# Patient Record
Sex: Male | Born: 1995 | Race: White | Hispanic: No | Marital: Single | Smoking: Never smoker
Health system: Southern US, Community
[De-identification: ages and names within clinical notes are randomized; demographics above are authoritative.]

## PROBLEM LIST (undated history)

## (undated) DIAGNOSIS — M1990 Unspecified osteoarthritis, unspecified site: Secondary | ICD-10-CM

---
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
## 2017-09-17 DIAGNOSIS — L74513 Primary focal hyperhidrosis, soles: Secondary | ICD-10-CM | POA: Diagnosis not present

## 2017-09-17 DIAGNOSIS — L400 Psoriasis vulgaris: Secondary | ICD-10-CM | POA: Diagnosis not present

## 2017-10-22 DIAGNOSIS — L255 Unspecified contact dermatitis due to plants, except food: Secondary | ICD-10-CM | POA: Diagnosis not present

## 2018-01-21 DIAGNOSIS — L74512 Primary focal hyperhidrosis, palms: Secondary | ICD-10-CM | POA: Diagnosis not present

## 2018-01-21 DIAGNOSIS — L400 Psoriasis vulgaris: Secondary | ICD-10-CM | POA: Diagnosis not present

## 2018-01-21 DIAGNOSIS — L738 Other specified follicular disorders: Secondary | ICD-10-CM | POA: Diagnosis not present

## 2018-02-16 DIAGNOSIS — L409 Psoriasis, unspecified: Secondary | ICD-10-CM | POA: Diagnosis not present

## 2018-02-16 DIAGNOSIS — M2550 Pain in unspecified joint: Secondary | ICD-10-CM | POA: Diagnosis not present

## 2018-02-16 DIAGNOSIS — M545 Low back pain: Secondary | ICD-10-CM | POA: Diagnosis not present

## 2018-03-23 DIAGNOSIS — L4050 Arthropathic psoriasis, unspecified: Secondary | ICD-10-CM | POA: Diagnosis not present

## 2018-03-23 DIAGNOSIS — R768 Other specified abnormal immunological findings in serum: Secondary | ICD-10-CM | POA: Diagnosis not present

## 2018-03-23 DIAGNOSIS — L409 Psoriasis, unspecified: Secondary | ICD-10-CM | POA: Diagnosis not present

## 2018-06-23 DIAGNOSIS — R768 Other specified abnormal immunological findings in serum: Secondary | ICD-10-CM | POA: Diagnosis not present

## 2018-06-23 DIAGNOSIS — L409 Psoriasis, unspecified: Secondary | ICD-10-CM | POA: Diagnosis not present

## 2018-06-23 DIAGNOSIS — L4050 Arthropathic psoriasis, unspecified: Secondary | ICD-10-CM | POA: Diagnosis not present

## 2019-08-22 ENCOUNTER — Other Ambulatory Visit: Payer: Self-pay

## 2019-08-22 ENCOUNTER — Emergency Department (HOSPITAL_COMMUNITY)
Admission: EM | Admit: 2019-08-22 | Discharge: 2019-08-22 | Disposition: A | Discharge status: Home / Self Care | Payer: BLUE CROSS/BLUE SHIELD | Attending: Emergency Medicine | Admitting: Emergency Medicine

## 2019-08-22 ENCOUNTER — Encounter (HOSPITAL_COMMUNITY): Payer: Self-pay

## 2019-08-22 ENCOUNTER — Emergency Department (HOSPITAL_COMMUNITY): Payer: BLUE CROSS/BLUE SHIELD

## 2019-08-22 DIAGNOSIS — S51011A Laceration without foreign body of right elbow, initial encounter: Secondary | ICD-10-CM | POA: Insufficient documentation

## 2019-08-22 DIAGNOSIS — Y92099 Unspecified place in other non-institutional residence as the place of occurrence of the external cause: Secondary | ICD-10-CM | POA: Insufficient documentation

## 2019-08-22 DIAGNOSIS — Y9383 Activity, rough housing and horseplay: Secondary | ICD-10-CM | POA: Insufficient documentation

## 2019-08-22 DIAGNOSIS — W010XXA Fall on same level from slipping, tripping and stumbling without subsequent striking against object, initial encounter: Secondary | ICD-10-CM | POA: Insufficient documentation

## 2019-08-22 DIAGNOSIS — L03113 Cellulitis of right upper limb: Secondary | ICD-10-CM | POA: Insufficient documentation

## 2019-08-22 DIAGNOSIS — Y999 Unspecified external cause status: Secondary | ICD-10-CM | POA: Insufficient documentation

## 2019-08-22 DIAGNOSIS — Z23 Encounter for immunization: Secondary | ICD-10-CM | POA: Insufficient documentation

## 2019-08-22 HISTORY — DX: Unspecified osteoarthritis, unspecified site: M19.90

## 2019-08-22 MED ORDER — DOXYCYCLINE HYCLATE 100 MG PO CAPS: 100.0000 mg | ORAL_CAPSULE | Freq: Two times a day (BID) | ORAL | 0 refills | Status: AC

## 2019-08-22 MED ORDER — IBUPROFEN 800 MG PO TABS
800.0000 mg | ORAL_TABLET | Freq: Once | ORAL | Status: AC
  Administered 2019-08-22: 800 mg via ORAL
  Filled 2019-08-22: qty 1

## 2019-08-22 MED ORDER — TETANUS-DIPHTH-ACELL PERTUSSIS 5-2.5-18.5 LF-MCG/0.5 IM SUSP
0.5000 mL | Freq: Once | INTRAMUSCULAR | Status: AC
  Administered 2019-08-22: 0.5 mL via INTRAMUSCULAR
  Filled 2019-08-22: qty 0.5

## 2019-08-22 MED ORDER — DOXYCYCLINE HYCLATE 100 MG PO TABS
100.0000 mg | ORAL_TABLET | Freq: Once | ORAL | Status: AC
  Administered 2019-08-22: 100 mg via ORAL
  Filled 2019-08-22: qty 1

## 2019-08-22 NOTE — ED Notes (Signed)
Pt given water for Fluid challenge
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
## 2019-08-22 NOTE — ED Provider Notes (Signed)
[facility] EMERGENCY DEPARTMENT Provider Note   CSN: [id number] Arrival date & time: 08/22/19  1443     History Chief Complaint  Patient presents with  . elbow redness    Juan Mclean is a 24 y.o. male.  HPI  Patient is a 24 year old male with no significant past medical history presented today for right elbow pain.  Patient states that he fell 1 month ago and cut his right elbow while wrestling with a friend of his.  He states that the area was slowly healing although it would open up occasionally due to the pain on his elbow.  He states that Friday he was roughhousing his friends again and fell and hit his right elbow on the ground again.  He states that this time there is no bleeding however since that time it is swollen and appears more red and irritated.  He states that it is painful to completely extend but no pain with flexion.  Denies any fevers, chills, headache, dizziness.     Past Medical History:  Diagnosis Date  . Arthritis     There are no problems to display for this patient.   History reviewed. No pertinent surgical history.     No family history on file.  Social History   Tobacco Use  . Smoking status: Never Smoker  Substance Use Topics  . Alcohol use: No  . Drug use: Not on file    Home Medications Prior to Admission medications   Medication Sig Start Date End Date Taking? Authorizing Provider  doxycycline (VIBRAMYCIN) 100 MG capsule Take 1 capsule (100 mg total) by mouth 2 (two) times daily. 08/22/19   Gailen Shelter, PA  ibuprofen (ADVIL,MOTRIN) 600 MG tablet Take 1 tablet (600 mg total) by mouth every 6 (six) hours as needed for pain. 12/27/12   Marcellina Millin, MD    Allergies    Erythromycin and Peanuts [peanut oil]  Review of Systems   Review of Systems  Constitutional: Negative for chills and fever.  HENT: Negative for congestion.   Eyes: Negative for pain.  Respiratory: Negative for cough and shortness of breath.    Cardiovascular: Negative for chest pain and leg swelling.  Gastrointestinal: Negative for abdominal pain and vomiting.  Genitourinary: Negative for dysuria.  Musculoskeletal: Negative for myalgias.       Left elbow pain, redness and swelling  Skin: Negative for rash.  Neurological: Negative for dizziness and headaches.    Physical Exam Updated Vital Signs BP 134/74 (BP Location: Right Arm)   Pulse (!) 110   Temp 98.6 F (37 C) (Oral)   Resp 18   SpO2 100%   Physical Exam Vitals and nursing note reviewed.  Constitutional:      General: He is not in acute distress.    Appearance: Normal appearance. He is not ill-appearing.  HENT:     Head: Normocephalic and atraumatic.     Mouth/Throat:     Mouth: Mucous membranes are moist.  Eyes:     General: No scleral icterus.       Right eye: No discharge.        Left eye: No discharge.     Conjunctiva/sclera: Conjunctivae normal.  Cardiovascular:     Pulses: Normal pulses.     Comments: Heart rate 102 on my exam  Bilateral radial pulses 3+ and symmetric. Pulmonary:     Effort: Pulmonary effort is normal.     Breath sounds: No stridor.  Musculoskeletal:  Comments: Full range of motion of elbow although there is some pain with completely locking elbow in extension.  Skin:    Comments: Left elbow with small 1 cm laceration over the olecranon.  It is in the process of healing but does appear open enough to drain if there is any purulence inside.  Was able to express any purulence, no fluctuance.  It is tender and red surrounding tissue extending out from the laceration approximately 4 inches in all directions.  There is no lymphangitis streaking.  Axillary lymph nodes are not palpable and are without any swelling or tenderness  Neurological:     Mental Status: He is alert and oriented to person, place, and time. Mental status is at baseline.            ED Results / Procedures / Treatments   Labs (all labs ordered are  listed, but only abnormal results are displayed) Labs Reviewed - No data to display  EKG None  Radiology DG Elbow Complete Right  Result Date: 08/22/2019 CLINICAL DATA:  Pain and swelling following fall 2 weeks ago, initial encounter EXAM: RIGHT ELBOW - COMPLETE 2 VIEW COMPARISON:  None. FINDINGS: No acute fracture or dislocation is noted. Mild soft tissue swelling is noted. IMPRESSION: Soft tissue swelling without acute bony abnormality. Electronically Signed   By: Inez Catalina M.D.   On: 08/22/2019 16:40    Procedures Ultrasound ED Soft Tissue  Date/Time: 08/22/2019 4:39 PM Performed by: Tedd Sias, PA Authorized by: Tedd Sias, PA   Procedure details:    Indications: localization of abscess     Transverse view:  Visualized   Longitudinal view:  Visualized   Images: not archived   Location:    Location: upper extremity     Side:  Left Findings:     no abscess present    cellulitis present Comments:     Cellulitis identified with ultrasound of the left elbow.  No significant fluid collection.  There is some cobblestoning present on ultrasound that does look suspicious for early abscess however laceration is somewhat open and would allow for any drainage.   (including critical care time)  Medications Ordered in ED Medications  ibuprofen (ADVIL) tablet 800 mg (800 mg Oral Given 08/22/19 1641)  doxycycline (VIBRA-TABS) tablet 100 mg (100 mg Oral Given 08/22/19 1641)    ED Course  I have reviewed the triage vital signs and the nursing notes.  Pertinent labs & imaging results that were available during my care of the patient were reviewed by me and considered in my medical decision making (see chart for details).  Clinical Course as of Aug 21 1649  Sun Aug 22, 2019  1644 X-ray of the elbow is without fracture or bony abnormality.  No evidence of osteomyelitis.  There is soft tissue swelling consistent with physical exam.   [WF]    Clinical Course User  Index [WF] Tedd Sias, [place]   MDM Rules/Calculators/A&P                      Patient with traumatic left elbow pain is had 2 injuries one 1 month ago and one Friday.  He went to urgent care this morning had an x-ray done and was told that it was unremarkable however he was instructed to follow-up with the ED with concern for cellulitis.  Cellulitis identified with ultrasound of the left elbow.  No significant fluid collection.   There is some cobblestoning present on  ultrasound that does look suspicious for early abscess however laceration is somewhat open and would allow for any drainage.  X-ray is unremarkable.  No evidence of osteomyelitis.  Will place patient on doxycycline for treatment of cellulitis with MRSA coverage in case of purulent material inside.  Patient given strict return precautions and recommended follow-up with primary care doctor.   Doubt DVT.  His symptoms came on relatively quickly and are associated with tenderness and redness rather than full arm swelling.  Doubt septic arthritis as patient has nearly full range of motion of his left elbow.  Doubt Abscess As Ultrasound Shows No Significant Fluid Collection.  Doubt disseminated gonorrhea as patient has no history of diarrhea and is able to get well after trauma rather than any sexual contact.  Doubt arterial injury as he has intact pulses and significant swelling.  Most likely this is superficial and contain cellulitis.  Nursing staff drew around cellulitis with skin pen to indicate current extent of cellulitis.  He knows to return to ED for follow-up if he has any new or concerning symptoms.  On reevaluation he states his pain is much improved after ibuprofen.  His heart rate is now 94.  Vital signs are other completely within normal limits.  Final Clinical Impression(s) / ED Diagnoses Final diagnoses:  Cellulitis of right upper extremity    Rx / DC Orders ED Discharge Orders         Ordered    doxycycline  (VIBRAMYCIN) 100 MG capsule  2 times daily     08/22/19 1651           Solon [place], [place] 08/22/19 1651    Arby Barrette, MD 08/23/19 [phone]

## 2019-08-22 NOTE — Discharge Instructions (Addendum)
Please take doxycycline as prescribed.  Please use Tylenol and ibuprofen for pain.  Please follow-up with your primary care doctor within 1 week for reassessment if symptoms persist and return immediately to ED if you have any new or concerning symptoms.  Especially if you spike a fever or unable to move your arm.  Please use Tylenol or ibuprofen for pain.  You may use 600 mg ibuprofen every 6 hours or 1000 mg of Tylenol every 6 hours.  You may choose to alternate between the 2.  This would be most effective.  Not to exceed 4 g of Tylenol within 24 hours.  Not to exceed 3200 mg ibuprofen 24 hours.

## 2020-12-30 ENCOUNTER — Emergency Department (HOSPITAL_BASED_OUTPATIENT_CLINIC_OR_DEPARTMENT_OTHER): Payer: 59

## 2020-12-30 ENCOUNTER — Emergency Department (HOSPITAL_BASED_OUTPATIENT_CLINIC_OR_DEPARTMENT_OTHER)
Admission: EM | Admit: 2020-12-30 | Discharge: 2020-12-30 | Disposition: A | Discharge status: Home / Self Care | Payer: 59 | Attending: Emergency Medicine | Admitting: Emergency Medicine

## 2020-12-30 ENCOUNTER — Other Ambulatory Visit: Payer: Self-pay

## 2020-12-30 ENCOUNTER — Encounter (HOSPITAL_BASED_OUTPATIENT_CLINIC_OR_DEPARTMENT_OTHER): Payer: Self-pay | Admitting: *Deleted

## 2020-12-30 DIAGNOSIS — W1789XA Other fall from one level to another, initial encounter: Secondary | ICD-10-CM | POA: Diagnosis not present

## 2020-12-30 DIAGNOSIS — Y990 Civilian activity done for income or pay: Secondary | ICD-10-CM | POA: Diagnosis not present

## 2020-12-30 DIAGNOSIS — Z91010 Allergy to peanuts: Secondary | ICD-10-CM | POA: Diagnosis not present

## 2020-12-30 DIAGNOSIS — S52571A Other intraarticular fracture of lower end of right radius, initial encounter for closed fracture: Secondary | ICD-10-CM | POA: Insufficient documentation

## 2020-12-30 DIAGNOSIS — S52501A Unspecified fracture of the lower end of right radius, initial encounter for closed fracture: Secondary | ICD-10-CM

## 2020-12-30 DIAGNOSIS — S6991XA Unspecified injury of right wrist, hand and finger(s), initial encounter: Secondary | ICD-10-CM | POA: Diagnosis present

## 2020-12-30 MED ORDER — OXYCODONE-ACETAMINOPHEN 5-325 MG PO TABS: 1.0000 | ORAL_TABLET | Freq: Three times a day (TID) | ORAL | 0 refills | Status: AC | PRN

## 2020-12-30 NOTE — ED Provider Notes (Signed)
[facility] EMERGENCY DEPT Provider Note   CSN: [id number] Arrival date & time: 12/30/20  1616     History Chief Complaint  Patient presents with   Rt arm injury    Juan Mclean is a 25 y.o. male.  HPI Patient fell off a side-by-side.  Landing on his right wrist.  Pain and swelling in the right wrist.  States he is worried he dislocated it.  Had previous fracture of the left wrist was seen by [facility].  Did not remember who he saw there however.  No elbow pain.  Denies head.  Not on anticoagulation.    Past Medical History:  Diagnosis Date   Arthritis     There are no problems to display for this patient.   History reviewed. No pertinent surgical history.     No family history on file.  Social History   Tobacco Use   Smoking status: Never   Smokeless tobacco: Never  Vaping Use   Vaping Use: Never used  Substance Use Topics   Alcohol use: No   Drug use: Never    Home Medications Prior to Admission medications   Medication Sig Start Date End Date Taking? Authorizing Provider  oxyCODONE-acetaminophen (PERCOCET/ROXICET) 5-325 MG tablet Take 1-2 tablets by mouth every 8 (eight) hours as needed for severe pain. 12/30/20  Yes Benjiman Core, MD  doxycycline (VIBRAMYCIN) 100 MG capsule Take 1 capsule (100 mg total) by mouth 2 (two) times daily. 08/22/19   Gailen Shelter, PA  ibuprofen (ADVIL,MOTRIN) 600 MG tablet Take 1 tablet (600 mg total) by mouth every 6 (six) hours as needed for pain. 12/27/12   Marcellina Millin, MD    Allergies    Erythromycin and Peanuts [peanut oil]  Review of Systems   Review of Systems  Constitutional:  Negative for appetite change.  Cardiovascular:  Negative for chest pain.  Gastrointestinal:  Negative for abdominal pain.  Musculoskeletal:        Right wrist pain.  Skin:  Negative for wound.  Neurological:  Negative for weakness and numbness.   Physical Exam Updated Vital Signs BP 127/83 (BP Location: Left  Arm)   Pulse 84   Temp 99.2 F (37.3 C) (Oral)   Resp 18   Ht 6\' 2"  (1.88 m)   Wt 124.7 kg   SpO2 100%   BMI 35.31 kg/m   Physical Exam Vitals and nursing note reviewed.  HENT:     Head: Atraumatic.  Eyes:     Pupils: Pupils are equal, round, and reactive to light.  Cardiovascular:     Rate and Rhythm: Regular rhythm.  Chest:     Chest wall: No tenderness.  Abdominal:     Tenderness: There is no abdominal tenderness.  Musculoskeletal:        General: Tenderness present.     Cervical back: Neck supple.     Comments: Tenderness and swelling to right wrist particularly on the distal radial area.  Mild ulnar angulation of the hand.  Skin intact.  Sensation intact in hand.  No tenderness over elbow or shoulder.  Skin:    General: Skin is warm.     Capillary Refill: Capillary refill takes less than 2 seconds.  Neurological:     Mental Status: He is alert and oriented to person, place, and time.    ED Results / Procedures / Treatments   Labs (all labs ordered are listed, but only abnormal results are displayed) Labs Reviewed - No data to display  EKG None  Radiology DG Forearm Right  Result Date: 12/30/2020 CLINICAL DATA:  Right forearm and wrist pain after fall. EXAM: RIGHT WRIST - COMPLETE 3+ VIEW; RIGHT FOREARM - 2 VIEW COMPARISON:  None. FINDINGS: Acute mildly impacted and dorsally angulated fracture of the distal radius with intra-articular extension. No additional fracture. The elbow is grossly unremarkable. No dislocation. Joint spaces are preserved. Bone mineralization is normal. Soft tissue swelling around the wrist. IMPRESSION: Acute mildly impacted and dorsally angulated intra-articular distal radius fracture. Electronically Signed   By: Obie Dredge M.D.   On: 12/30/2020 17:42   DG Wrist Complete Right  Result Date: 12/30/2020 CLINICAL DATA:  Right forearm and wrist pain after fall. EXAM: RIGHT WRIST - COMPLETE 3+ VIEW; RIGHT FOREARM - 2 VIEW COMPARISON:   None. FINDINGS: Acute mildly impacted and dorsally angulated fracture of the distal radius with intra-articular extension. No additional fracture. The elbow is grossly unremarkable. No dislocation. Joint spaces are preserved. Bone mineralization is normal. Soft tissue swelling around the wrist. IMPRESSION: Acute mildly impacted and dorsally angulated intra-articular distal radius fracture. Electronically Signed   By: Obie Dredge M.D.   On: 12/30/2020 17:42   CT Wrist Right Wo Contrast  Result Date: 12/30/2020 CLINICAL DATA:  Right distal radius fracture after fall. EXAM: CT OF THE RIGHT WRIST WITHOUT CONTRAST TECHNIQUE: Multidetector CT imaging of the right wrist was performed according to the standard protocol. Multiplanar CT image reconstructions were also generated. COMPARISON:  Right wrist x-rays from same day. FINDINGS: Bones/Joint/Cartilage Acute comminuted mildly impacted and dorsally angulated fracture of the distal radius with intra-articular extension. Less than 5 mm impaction and dorsal displacement. No dislocation. Joint spaces are preserved. Small radiocarpal lipohemarthrosis. Ligaments Ligaments are suboptimally evaluated by CT. Muscles and Tendons Grossly intact. Small lipohematoma in the second extensor compartment tendon sheaths. Soft tissue Soft tissue swelling about the wrist. No fluid collection or hematoma. No soft tissue mass. IMPRESSION: 1. Acute comminuted mildly impacted and dorsally angulated intra-articular fracture of the distal radius. 2. Small radiocarpal lipohemarthrosis. 3. Small lipohematoma in the second extensor compartment tendon sheaths. Electronically Signed   By: Obie Dredge M.D.   On: 12/30/2020 19:08    Procedures Procedures   Medications Ordered in ED Medications - No data to display  ED Course  I have reviewed the triage vital signs and the nursing notes.  Pertinent labs & imaging results that were available during my care of the patient were reviewed  by me and considered in my medical decision making (see chart for details).    MDM Rules/Calculators/A&P                          Patient with fall.  Right wrist injury.  X-ray showed distal radius fracture.  Some angulation.  The it is intra articular and likely require surgery.  Discussed with PA on-call for [facility].  They were not sure who patient had seen but thought it may have been [place].  Patient later replied or was able to figure out that it was likely [place].  [facility] is going to follow-up and call for an appointment.  CT scan ordered for further OR planning.  Discharge home with some pain medicine Final Clinical Impression(s) / ED Diagnoses Final diagnoses:  Closed fracture of distal end of right radius, unspecified fracture morphology, initial encounter    Rx / DC Orders ED Discharge Orders          Ordered  oxyCODONE-acetaminophen (PERCOCET/ROXICET) 5-325 MG tablet  Every 8 hours PRN        12/30/20 1937             Benjiman Core, MD 12/30/20 1941

## 2020-12-30 NOTE — ED Notes (Signed)
Ice pack and sligh provided in triage,

## 2020-12-30 NOTE — ED Triage Notes (Signed)
Pt was working on Side by side, fell off the top, landed on rt hand, wrist. Deformity and swelling.

## 2021-05-21 ENCOUNTER — Other Ambulatory Visit: Payer: Self-pay | Admitting: Family Medicine

## 2021-05-21 DIAGNOSIS — Z8249 Family history of ischemic heart disease and other diseases of the circulatory system: Secondary | ICD-10-CM

## 2021-05-21 DIAGNOSIS — R519 Headache, unspecified: Secondary | ICD-10-CM

## 2021-05-23 ENCOUNTER — Other Ambulatory Visit: Payer: Self-pay | Admitting: Family Medicine

## 2021-05-23 DIAGNOSIS — Z8249 Family history of ischemic heart disease and other diseases of the circulatory system: Secondary | ICD-10-CM

## 2021-05-23 DIAGNOSIS — R519 Headache, unspecified: Secondary | ICD-10-CM

## 2021-05-24 ENCOUNTER — Ambulatory Visit
Admission: RE | Admit: 2021-05-24 | Discharge: 2021-05-24 | Disposition: A | Discharge status: Home / Self Care | Payer: 59 | Source: Ambulatory Visit | Attending: Family Medicine | Admitting: Family Medicine

## 2021-05-24 DIAGNOSIS — Z8249 Family history of ischemic heart disease and other diseases of the circulatory system: Secondary | ICD-10-CM

## 2021-05-24 DIAGNOSIS — R519 Headache, unspecified: Secondary | ICD-10-CM

## 2021-05-24 MED ORDER — GADOBENATE DIMEGLUMINE 529 MG/ML IV SOLN
20.0000 mL | Freq: Once | INTRAVENOUS | Status: AC | PRN
  Administered 2021-05-24: 11:00:00 20 mL via INTRAVENOUS

## 2021-05-29 ENCOUNTER — Other Ambulatory Visit: Payer: 59

## 2021-12-08 IMAGING — CT CT WRIST*R* W/O CM
3 series · 15 of 35 positions shown, 18 images · non-contrast
Comparison: Right wrist x-rays from same day.

CLINICAL DATA: Right distal radius fracture after fall.

EXAM:
CT OF THE RIGHT WRIST WITHOUT CONTRAST
TECHNIQUE: Multidetector CT imaging of the right wrist was performed according
to the standard protocol. Multiplanar CT image reconstructions were
also generated.

[Series 5: axial soft · axial · 0.32mm/px · z∈[-254,-138]mm · 7 of 70 slices shown, 9 images]
[im 6/70  soft-tissue]
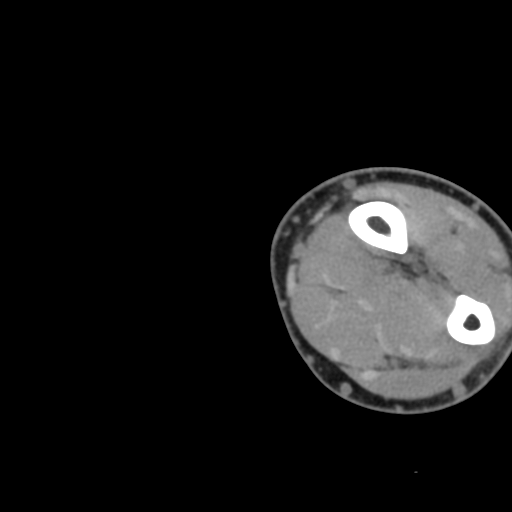
[im 6/70  bone]
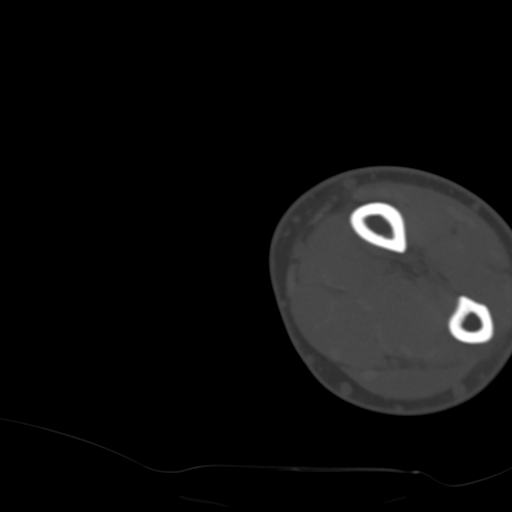
[im 16/70  bone]
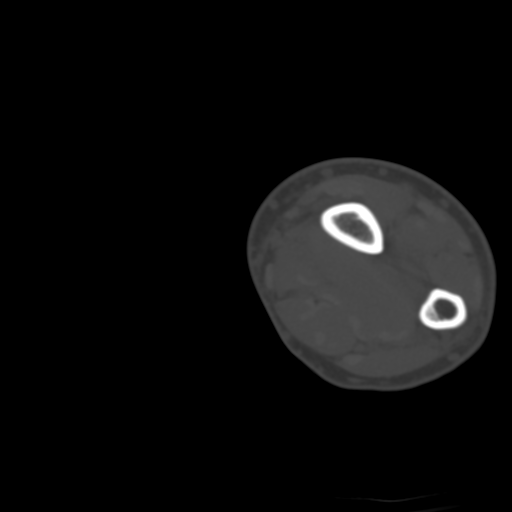
[im 27/70  bone]
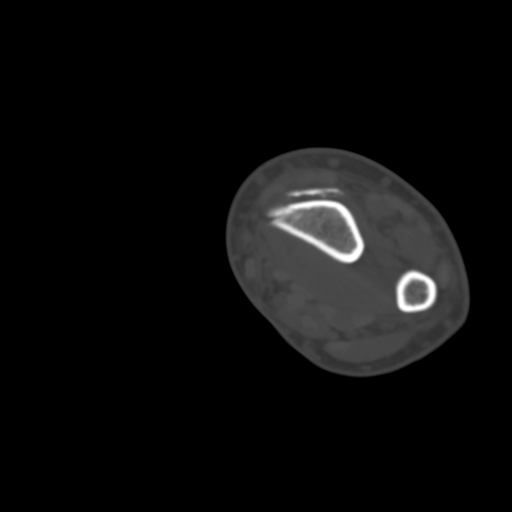
[im 38/70  bone]
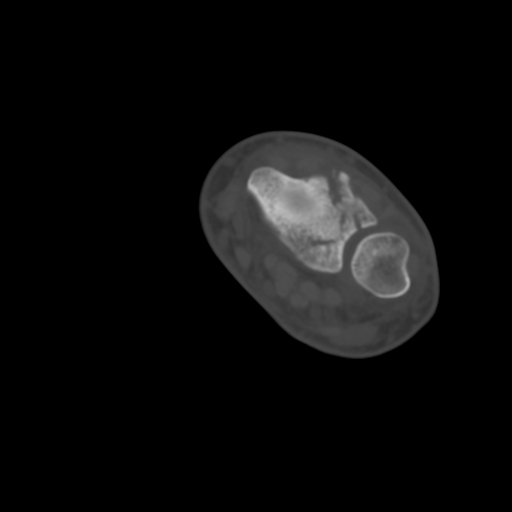
[im 43/70  soft-tissue]
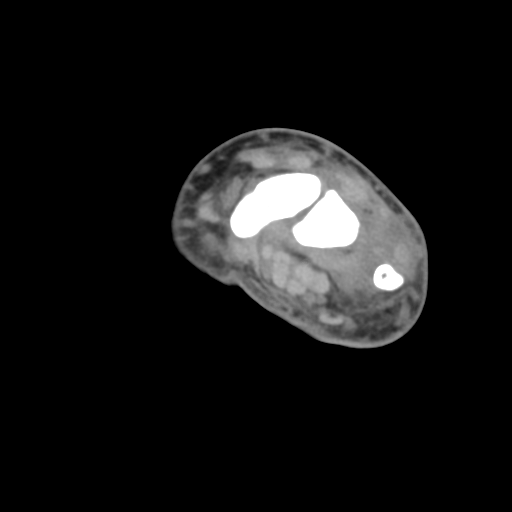
[im 43/70  bone]
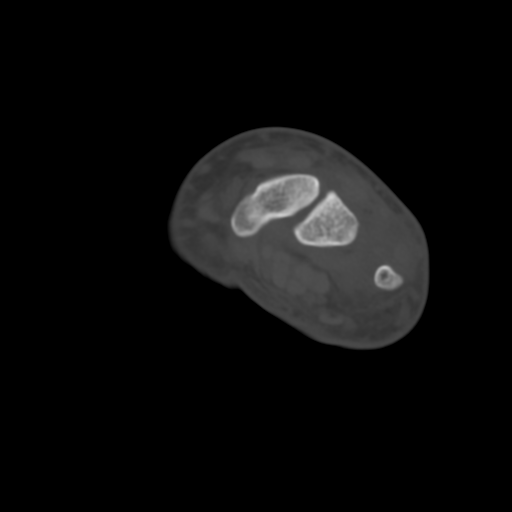
[im 54/70  bone]
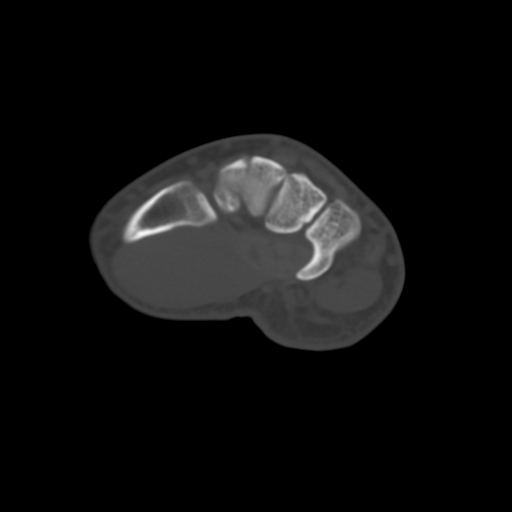
[im 64/70  bone]
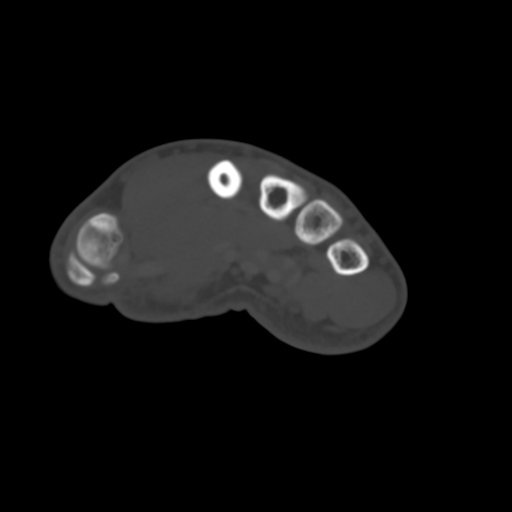

[Series 7: coronal soft · coronal · 0.29mm/px · 3 of 50 slices shown]
[im 10/50  bone]
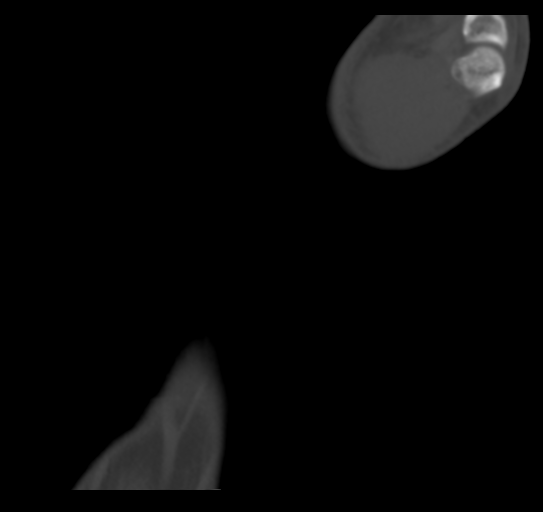
[im 20/50  bone]
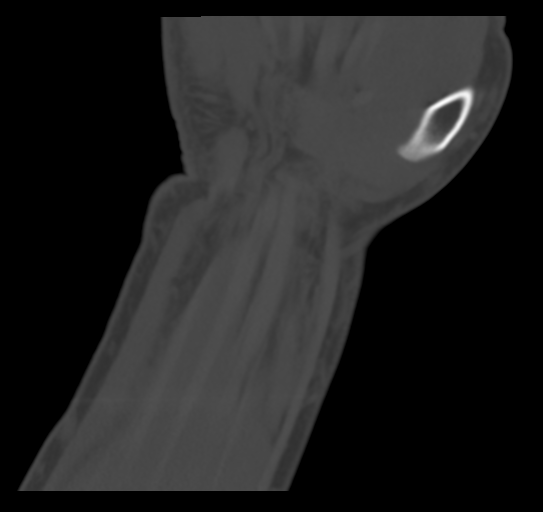
[im 30/50  bone]
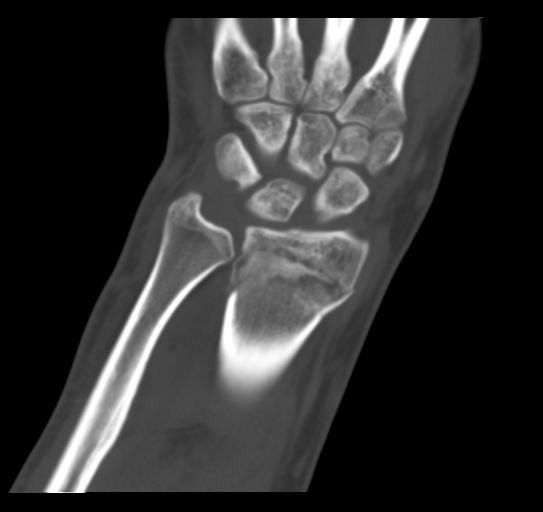

[Series 9: sagittal soft · sagittal · 0.23mm/px · 5 of 55 slices shown, 6 images]
[im 19/55  bone]
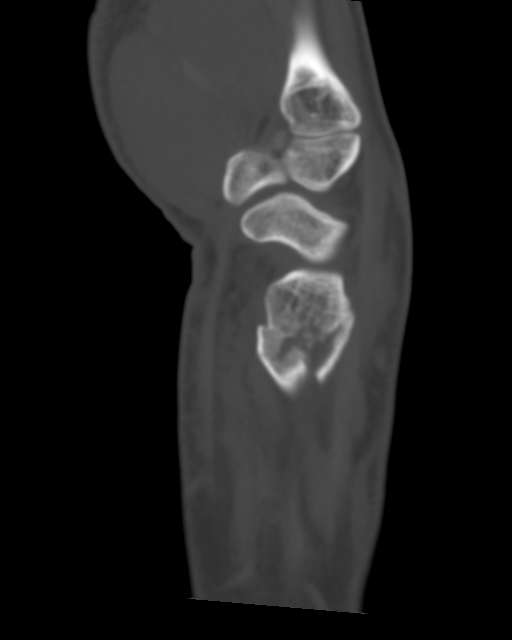
[im 23/55  bone]
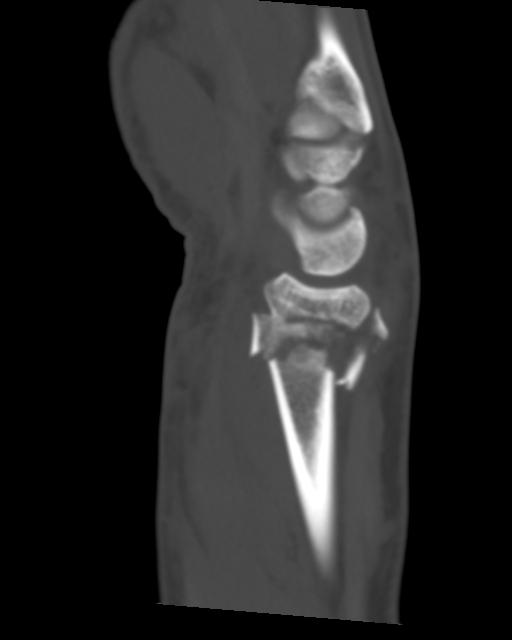
[im 28/55  soft-tissue]
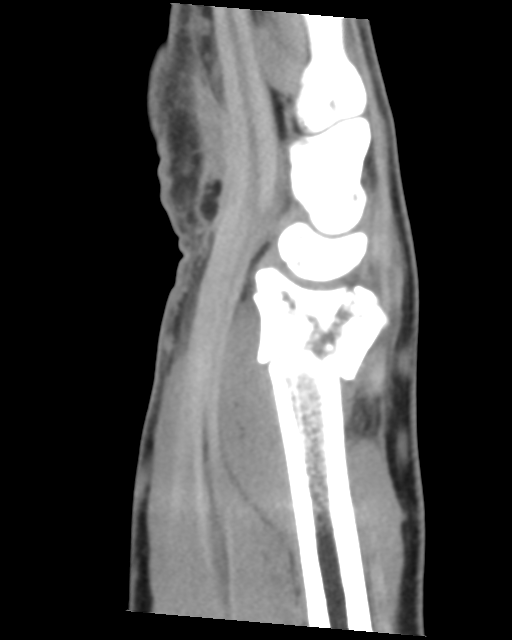
[im 28/55  bone]
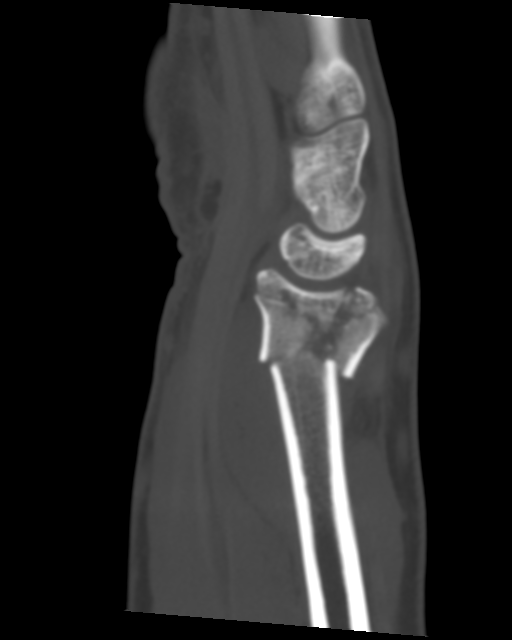
[im 32/55  bone]
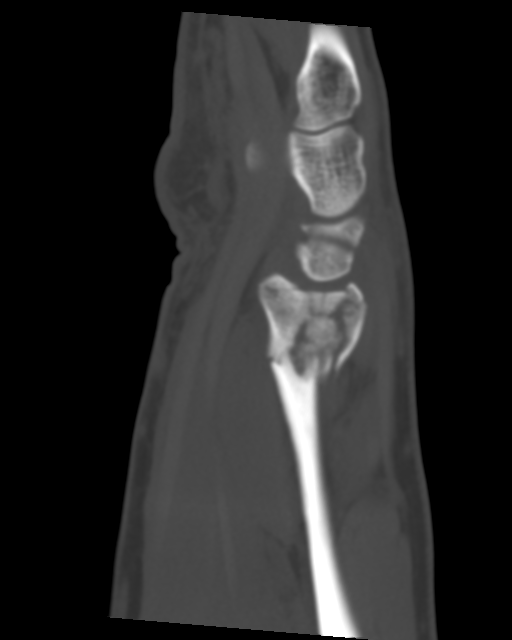
[im 37/55  bone]
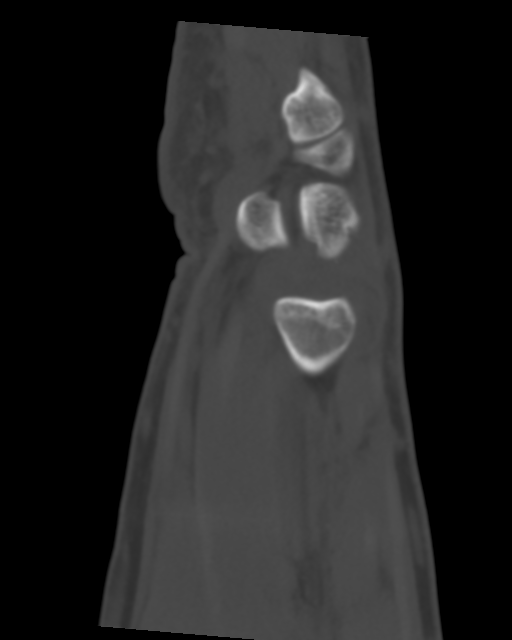

[15 of 35 positions shown; findings below may reference images not displayed]

FINDINGS: Bones/Joint/Cartilage

Acute comminuted mildly impacted and dorsally angulated fracture of
the distal radius with intra-articular extension. Less than 5 mm
impaction and dorsal displacement. No dislocation. Joint spaces are
preserved. Small radiocarpal lipohemarthrosis.

Ligaments

Ligaments are suboptimally evaluated by CT.

Muscles and Tendons
Grossly intact. Small lipohematoma in the second extensor
compartment tendon sheaths.

Soft tissue
Soft tissue swelling about the wrist. No fluid collection or
hematoma. No soft tissue mass.
IMPRESSION: 1. Acute comminuted mildly impacted and dorsally angulated
intra-articular fracture of the distal radius.
2. Small radiocarpal lipohemarthrosis.
3. Small lipohematoma in the second extensor compartment tendon
sheaths.

## 2023-12-11 ENCOUNTER — Other Ambulatory Visit: Payer: Self-pay | Admitting: *Deleted

## 2023-12-11 DIAGNOSIS — Z31440 Encounter of male for testing for genetic disease carrier status for procreative management: Secondary | ICD-10-CM

## 2023-12-24 ENCOUNTER — Ambulatory Visit: Payer: Self-pay | Admitting: Family Medicine
